# Patient Record
Sex: Male | Born: 1965
Health system: Southern US, Community
[De-identification: ages and names within clinical notes are randomized; demographics above are authoritative.]

## PROBLEM LIST (undated history)

## (undated) DIAGNOSIS — M79605 Pain in left leg: Secondary | ICD-10-CM

## (undated) HISTORY — DX: Pain in left leg: M79.605

## (undated) HISTORY — PX: OTHER SURGICAL HISTORY: SHX169

---

## 2013-08-04 ENCOUNTER — Ambulatory Visit
Admission: RE | Admit: 2013-08-04 | Discharge: 2013-08-04 | Disposition: A | Discharge status: Home / Self Care | Payer: Worker's Compensation | Source: Ambulatory Visit | Attending: Family | Admitting: Family

## 2013-08-04 ENCOUNTER — Other Ambulatory Visit: Payer: Self-pay | Admitting: Family

## 2013-08-04 DIAGNOSIS — M25572 Pain in left ankle and joints of left foot: Secondary | ICD-10-CM

## 2013-08-11 ENCOUNTER — Other Ambulatory Visit: Payer: Self-pay | Admitting: Family

## 2013-08-11 ENCOUNTER — Ambulatory Visit
Admission: RE | Admit: 2013-08-11 | Discharge: 2013-08-11 | Disposition: A | Discharge status: Home / Self Care | Payer: Worker's Compensation | Source: Ambulatory Visit | Attending: Family | Admitting: Family

## 2013-08-11 DIAGNOSIS — M545 Low back pain: Secondary | ICD-10-CM

## 2013-09-24 ENCOUNTER — Ambulatory Visit
Admission: RE | Admit: 2013-09-24 | Discharge: 2013-09-24 | Disposition: A | Discharge status: Home / Self Care | Payer: 59 | Source: Ambulatory Visit | Attending: Orthopaedic Surgery | Admitting: Orthopaedic Surgery

## 2013-09-24 ENCOUNTER — Other Ambulatory Visit: Payer: Self-pay | Admitting: Orthopaedic Surgery

## 2013-09-24 DIAGNOSIS — R609 Edema, unspecified: Secondary | ICD-10-CM

## 2013-09-24 DIAGNOSIS — R52 Pain, unspecified: Secondary | ICD-10-CM

## 2013-09-30 ENCOUNTER — Ambulatory Visit (INDEPENDENT_AMBULATORY_CARE_PROVIDER_SITE_OTHER): Payer: 59 | Admitting: Neurology

## 2013-09-30 ENCOUNTER — Encounter: Payer: Self-pay | Admitting: Neurology

## 2013-09-30 ENCOUNTER — Telehealth: Payer: Self-pay | Admitting: Neurology

## 2013-09-30 ENCOUNTER — Encounter (INDEPENDENT_AMBULATORY_CARE_PROVIDER_SITE_OTHER): Payer: Self-pay

## 2013-09-30 VITALS — BP 143/89 | HR 72 | Ht 77.0 in | Wt 226.0 lb

## 2013-09-30 DIAGNOSIS — M79672 Pain in left foot: Secondary | ICD-10-CM | POA: Insufficient documentation

## 2013-09-30 DIAGNOSIS — M79609 Pain in unspecified limb: Secondary | ICD-10-CM

## 2013-09-30 MED ORDER — DICLOFENAC SODIUM 1 % TD CREA: TOPICAL_CREAM | TRANSDERMAL | Status: AC

## 2013-09-30 MED ORDER — TRAMADOL HCL 50 MG PO TABS: 50.0000 mg | ORAL_TABLET | Freq: Four times a day (QID) | ORAL | Status: AC | PRN

## 2013-09-30 NOTE — Progress Notes (Signed)
PATIENT: John Moses DOB: 03/21/1966  HISTORICAL  John Moses is referred by his orthopedic patient physician Dr. 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  2 hours later, he noticed left first toe pain, at the bottom, and also between the first web space, later also noticed shooting pain to his left calf, pain to his left lateral leg, sometimes even above-knee level to his left lateral thigh, hip region but he denies significant low back pain,  Worsening pain of bearing weight, he denies shooting pain to his right lower extremity, no bowel bladder incontinence,  Over the past couple weeks, he reported 50% improvement,    MRI of left foot at [facility] orthopedic specialist  showed mild increased signal within the medial hallux sesamoid, there is no evidence of fracture   REVIEW OF SYSTEMS: Full 14 system review of systems performed and notable only for murmur, constipation, increased thirst, joint pain, achy muscles,   ALLERGIES: No Known Allergies  HOME MEDICATIONS:  PAST MEDICAL HISTORY: Past Medical History  Diagnosis Date  . Left leg pain     PAST SURGICAL HISTORY: Past Surgical History  Procedure Laterality Date  . Left hand      FAMILY HISTORY: Family History  Problem Relation Age of Onset  . High blood pressure Mother   . Diabetes Mother     SOCIAL HISTORY:  History   Social History  . Marital Status: Married    Spouse Name: N/A    Number of Children: 2  . Years of Education: 12   Occupational History    [employer], heavy lifting,    Social History Main Topics  . Smoking status: Never Smoker   . Smokeless tobacco: Never Used  . Alcohol Use: 0.5 oz/week    1 drink(s) per week     Comment: OCC  . Drug Use: No  . Sexual Activity:  Not on file   Other Topics Concern  . Not on file   Social History Narrative   Called patient. Advised patient of provider's approval for requested procedure, as well as any comments/instructions from provider.       Provided patient w/ verbal instructions concerning pre-, intra- and post-procedure preparation and instructions.      Patient verbalized understanding of the above.       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   Generalized: In no acute distress  Neck: Supple, no carotid bruits   Cardiac: Regular rate rhythm  Pulmonary: Clear to auscultation bilaterally  Musculoskeletal: No deformity  Neurological examination  Mentation: Alert oriented to time, place, history taking, and causual conversation  Cranial nerve II-XII: Pupils were equal round reactive to light. Extraocular movements were full.  Visual field were full on confrontational test. Bilateral fundi were sharp.  Facial sensation and strength were  normal. Hearing was intact to finger rubbing bilaterally. Uvula tongue midline.  Head turning and shoulder shrug and were normal and symmetric.Tongue protrusion into cheek strength was normal.  Motor: Normal tone, bulk and strength, tenderness at the base of left first toe, mild swelling  Sensory: Intact to fine touch, pinprick, preserved vibratory sensation, and proprioception at toes.  Coordination: Normal finger to nose, heel-to-shin bilaterally there was no truncal ataxia  Gait:  Atlgic due to left foot pain   Romberg signs: Negative  Deep tendon reflexes: Brachioradialis 2/2, biceps 2/2, triceps 2/2,  patellar 2/2, Achilles 2/2, plantar responses were flexor bilaterally.    ASSESSMENT AND PLAN  [name] is a 48 y.o. male  complains of left foot pain, after weight bearing, tenderness aTommi Emeryt the base of left first toe, consistent with left sesamoid damage, when necessary mobic hot compression, avoid weight bearing    There was no evidence of left lumbar radiculopathy, left lower extremity neuropathy,   Levert FeinsteinYijun Lavelle Akel, M.D. Ph.D.  [facility] [address], [address] [place], [place] [address] [phone]

## 2013-09-30 NOTE — Telephone Encounter (Signed)
John Moses, PT's wife, called and stated that the Orthopedic doctor - [name] that [name] had referred them to has moved to [place].  They have asked if there is another doctor she would recommend or should they see if someone at [name]'s current orthopedic doctor [facility] could assist with his problem.  Please call.  Thank you.

## 2013-10-01 NOTE — Telephone Encounter (Signed)
I called the pharmacy.  Spoke with Nedra HaiLee.  He said the diclofenac is not available in the cream form, so they are dispensing the gel.  The strength is the same.

## 2013-10-01 NOTE — Telephone Encounter (Signed)
Per Lynden Angathy, prescription only comes in  gel and is ready for pick up at preferred pharmacy, informed patient and he verbalized understanding

## 2013-10-01 NOTE — Telephone Encounter (Signed)
Patient also wanted to know why prescription(diclofenac% cream) was not at their pharmacy

## 2013-10-01 NOTE — Telephone Encounter (Signed)
Wife said that she would like for Dr Terrace ArabiaYan to recommend another orthopedic doctor since Dr Inda MerlinBednorz has moved

## 2013-10-02 ENCOUNTER — Ambulatory Visit (INDEPENDENT_AMBULATORY_CARE_PROVIDER_SITE_OTHER): Payer: 59 | Admitting: Neurology

## 2013-10-02 ENCOUNTER — Encounter (INDEPENDENT_AMBULATORY_CARE_PROVIDER_SITE_OTHER): Payer: Self-pay | Admitting: Radiology

## 2013-10-02 DIAGNOSIS — R29898 Other symptoms and signs involving the musculoskeletal system: Secondary | ICD-10-CM | POA: Insufficient documentation

## 2013-10-02 DIAGNOSIS — M79672 Pain in left foot: Secondary | ICD-10-CM

## 2013-10-02 DIAGNOSIS — M79609 Pain in unspecified limb: Secondary | ICD-10-CM

## 2013-10-02 DIAGNOSIS — Z0289 Encounter for other administrative examinations: Secondary | ICD-10-CM

## 2013-10-02 NOTE — Procedures (Signed)
   NCS (NERVE CONDUCTION STUDY) WITH EMG (ELECTROMYOGRAPHY) REPORT   STUDY DATE: February 12th 2015 PATIENT NAME: John Moses DOB: 01/14/1966 MRN: [id number]    TECHNOLOGIST: Kaylyn LimSue Fox ELECTROMYOGRAPHER: Levert FeinsteinYan, Charlott Calvario M.D.  CLINICAL INFORMATION:  48 years old right-handed [place] male, presenting with left foot pain, left hip pain after pulling heavy object  On examination: He has mild left first web space swelling, tenderness of left hallux, mild left ankle dorsiflexion weakness, also limited by pain  FINDINGS: NERVE CONDUCTION STUDY: Bilateral sural sensory responses were normal.  Left peroneal sensory response was absent. Right peroneal sensory response was normal. Bilateral tibial motor responses were normal.  Right peroneal to EDB  motor responses was absent.   Right peroneal to tibialis anterior motor  responses are normal.  Left peroneal to EDB motor responses showed significantly decreased C. map amplitude,  left peroneal to tibialis anterior also demonstrated significantly decreased C. map amplitude.    NEEDLE ELECTROMYOGRAPHY:  Selected needle examination was performed at left lower extremity muscles and left lumbosacral paraspinal muscles.  Left tibialis anterior , peroneal longus: Increased insertion activity, 2 plus spontaneous activity, enlarged complex motor unit potential with decreased recruitment patterns  Left medial gastrocnemius: Increased insertion activity, 1 plus spontaneous activity, enlarged complex motor unit potential with decreased recruitment patterns  Left vastus lateralis biceps femoris short head: Increased insertion activity,  no spontaneous activity, mixture of normal and some enlarged motor unit potential, with mildly decreased recruitment patterns.    He has poor relaxation, there was no spontaneous activity at the left lumbosacral paraspinal muscles.        IMPRESSION:   This is an abnormal study. 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     INTERPRETING PHYSICIAN:   Levert FeinsteinYan, Taria Castrillo M.D. Ph.D. [facility] [address], [address] [place], [place] [address] [phone]

## 2013-10-07 ENCOUNTER — Telehealth: Payer: Self-pay | Admitting: Neurology

## 2013-10-07 NOTE — Telephone Encounter (Signed)
[name], [facility] Called preauthorization for [place] MRI of L femur, he has active denervation on EMG/NCS,  DDx including left lumbar radiculopathy vs Left sciatic neuropathy.   He needs both MRI lumbar and MRI left sciatic pathway.  Pre-authorization Number: [id number]. Exp in 45 days, November 21, 2013.

## 2013-10-08 ENCOUNTER — Ambulatory Visit (INDEPENDENT_AMBULATORY_CARE_PROVIDER_SITE_OTHER): Payer: 59

## 2013-10-08 DIAGNOSIS — R29898 Other symptoms and signs involving the musculoskeletal system: Secondary | ICD-10-CM

## 2013-10-15 ENCOUNTER — Telehealth: Payer: Self-pay | Admitting: Neurology

## 2013-10-15 NOTE — Telephone Encounter (Signed)
John Moses, [name] assistant made an appt for patient to come into the office.

## 2013-10-15 NOTE — Telephone Encounter (Signed)
Lft msg for pt to call back, did advise we didn't show any availability for this visit. Per notes pt needs to be seen within 2 wks. Would like for nurse to call pt to set up an apt that you may have on your end that I don't. Thanks

## 2013-10-15 NOTE — Telephone Encounter (Signed)
Patient called back and he is scheduled with Dr.Yan 10-17-2013. Spoke to patient.

## 2013-10-15 NOTE — Telephone Encounter (Signed)
Called patient and left message for him to call office back for follow up apt.

## 2013-10-16 ENCOUNTER — Ambulatory Visit: Payer: 59 | Admitting: Neurology

## 2013-10-17 ENCOUNTER — Encounter: Payer: Self-pay | Admitting: *Deleted

## 2013-10-17 ENCOUNTER — Ambulatory Visit: Payer: Self-pay | Admitting: Neurology

## 2013-10-17 ENCOUNTER — Telehealth: Payer: Self-pay | Admitting: Neurology

## 2013-10-17 DIAGNOSIS — G5702 Lesion of sciatic nerve, left lower limb: Secondary | ICD-10-CM

## 2013-10-17 NOTE — Progress Notes (Signed)
Quick Note:  Shared normal MR Lumbar Spine results thru VM message ______

## 2013-10-17 NOTE — Telephone Encounter (Signed)
Patient requesting results on the MRI of his leg. Please call.

## 2013-10-17 NOTE — Telephone Encounter (Signed)
John Moses, I also ordered MRI of right thigh, please findout weather he had it or not,   I failed to reach him, left message for him

## 2013-10-17 NOTE — Telephone Encounter (Signed)
[name] was sent a note regarding this message.

## 2013-10-17 NOTE — Telephone Encounter (Signed)
Patient requesting a note for work because his appt was cancelled due to the weather and patient also requesting his MRI results of his leg. Please advise.

## 2013-10-17 NOTE — Telephone Encounter (Signed)
Patient requesting another note for being off work due to having to reschedule today's appointment due to weather. Please call to advise.

## 2013-10-21 ENCOUNTER — Telehealth: Payer: Self-pay | Admitting: Neurology

## 2013-10-21 NOTE — Telephone Encounter (Signed)
Pt called and stated that he did have the MRI on his right thigh on 2-18.  He would like to know the results from that.  Please call.  Thank you

## 2013-10-21 NOTE — Telephone Encounter (Signed)
Created by mistake

## 2013-10-24 DIAGNOSIS — G5702 Lesion of sciatic nerve, left lower limb: Secondary | ICD-10-CM | POA: Insufficient documentation

## 2013-10-24 NOTE — Telephone Encounter (Signed)
I have received a call from radiologist concerning his MRI of left femoral, there is evidence of left hamstring muscle denervation, but no compressing lesions to the left sciatic nerve  I have discussed with him about findings, he continued to have left leg pain, weakness, we will proceed with MRI of pelvic, with without contrast, continue followup in March 11

## 2013-10-28 NOTE — Telephone Encounter (Signed)
I got a stat MRI pelvic, there was no evidence of left sciatic nerve compression, mild bilateral SI joint disease. There is no evidence of bilateral illopsaos muscle signal abnormality  Keep follow up as previous scheduled.

## 2013-10-29 ENCOUNTER — Telehealth: Payer: Self-pay | Admitting: Neurology

## 2013-10-29 ENCOUNTER — Encounter: Payer: Self-pay | Admitting: Neurology

## 2013-10-29 ENCOUNTER — Ambulatory Visit (INDEPENDENT_AMBULATORY_CARE_PROVIDER_SITE_OTHER): Payer: 59 | Admitting: Neurology

## 2013-10-29 VITALS — BP 124/81 | HR 75 | Ht 76.0 in | Wt 219.0 lb

## 2013-10-29 DIAGNOSIS — G5702 Lesion of sciatic nerve, left lower limb: Secondary | ICD-10-CM

## 2013-10-29 DIAGNOSIS — G5700 Lesion of sciatic nerve, unspecified lower limb: Secondary | ICD-10-CM

## 2013-10-29 NOTE — Progress Notes (Signed)
PATIENT: John Moses DOB: [birth year]  HISTORICAL  John Moses is referred by his orthopedic patient physician Dr. 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  2 hours later, he noticed left first toe pain, at the bottom, and also between the first web space, later also noticed shooting pain to his left calf, pain to his left lateral leg, sometimes even above-knee level to his left lateral thigh, hip region but he denies significant low back pain,  Worsening pain of bearing weight, he denies shooting pain to his right lower extremity, no bowel bladder incontinence,  Over the past couple weeks, he reported 50% improvement,    MRI of left foot at [facility] orthopedic specialist  showed mild increased signal within the medial hallux sesamoid, there is no evidence of fracture  UPDATE October 29 2013: His left foot, toe pain has much improved, 50% improvement, he was given NSAIDs, he complains of mild left lateral ankle pain while walking. He denies significant low back pain,  We have reviewed MRI lumbar, pelvic, and left femoral together, there was evidence of left hamstring muscle signal change, but there was no evidence of left sciatic nerve compression, MRI of lumbar was normal   REVIEW OF SYSTEMS: Full 14 system review of systems performed and notable only for unexpected weight change, light sensitivity, ALLERGIES: No Known Allergies  HOME MEDICATIONS:  PAST MEDICAL HISTORY: Past Medical History  Diagnosis Date  . Left leg pain     PAST SURGICAL HISTORY: Past Surgical History  Procedure Laterality Date  . Left hand      FAMILY HISTORY: Family History  Problem Relation Age of Onset  . High blood pressure Mother   . Diabetes Mother     SOCIAL HISTORY:  History    Social History  . Marital Status: Married    Spouse Name: N/A    Number of Children: 2  . Years of Education: 12   Occupational History    [employer], heavy lifting,    Social History Main Topics  . Smoking status: Never Smoker   . Smokeless tobacco: Never Used  . Alcohol Use: 0.5 oz/week    1 drink(s) per week     Comment: OCC  . Drug Use: No  . Sexual Activity: Not on file   Other Topics Concern  . Not on file   Social History Narrative   Called patient. Advised patient of provider's approval for requested procedure, as well as any comments/instructions from provider.       Provided patient w/ verbal instructions concerning pre-, intra- and post-procedure preparation and instructions.      Patient verbalized understanding of the above.       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   Generalized: In no acute distress  Neck: Supple, no carotid  bruits   Cardiac: Regular rate rhythm  Pulmonary: Clear to auscultation bilaterally  Musculoskeletal: No deformity  Neurological examination  Mentation: Alert oriented to time, place, history taking, and causual conversation  Cranial nerve II-XII: Pupils were equal round reactive to light. Extraocular movements were full.  Visual field were full on confrontational test. Bilateral fundi were sharp.  Facial sensation and strength were normal. Hearing was intact to finger rubbing bilaterally. Uvula tongue midline.  Head turning and shoulder shrug and were normal and symmetric.Tongue protrusion into cheek strength was normal.  Motor: tenderness at the base of left  first toe, mild swelling, mild left ankle dorsiflexion weakness 4, plantar flexion weakness 4 minus, eversion 4,  Inversion, 4 minus  Sensory: decreased light touch at left first web space, extending to left dorsal foot and lateral ankle  Coordination: Normal finger to nose, heel-to-shin bilaterally there was no truncal ataxia  Gait:  Atlgic due to left foot pain, he could not step on left tiptoe, or heel,  Romberg signs: Negative  Deep tendon reflexes: Brachioradialis 2/2, biceps 2/2, triceps 2/2, patellar 2/2, Achilles 2/1, plantar responses were flexor bilaterally.    ASSESSMENT AND PLAN  John Emeryrevis L Recendiz is a 48 y.o. male  complains of left foot pain, after weight bearing, distal left leg weakness, evidence of left sciatic neuropathy, proximal to the takeoff left biceps femoris long head  1. 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 continue physical therapy 3 return to clinic in 6 months.    Levert FeinsteinYijun Kimon Loewen, M.D. Ph.D.  [facility] [address], [address] [place], [place] [address] [phone]

## 2013-10-29 NOTE — Telephone Encounter (Signed)
Pt called and stated that when he was in the office today, [name] gave him a letter stating that it is ok for him to return to work, but to avoid heavy lifting and time on his feet. He stated that his job is requesting clarification of what she means by heavy lifting - the weight limit and what is considered a long time walking and on his feet.  He states that he is supposed to return to work on Monday, and he stated that he can come by anytime before then to pick it up.  Please call if necessary.  Thank you

## 2013-10-30 ENCOUNTER — Telehealth: Payer: Self-pay | Admitting: Neurology

## 2013-10-30 NOTE — Telephone Encounter (Signed)
Patient is calling about the letter that was written to return to work does not have a specific amount of weight that the patient can lift. Patient is requesting that it be put into the letter. Please advise.

## 2013-10-30 NOTE — Telephone Encounter (Signed)
Lupita LeashDonna:  Please call patient, advise him to get paperwork from his human resource, we can fill the paper work, accordingly.  If his HR does not have paperwork, please clarify with him, carry less then 15 Lb, on feet less than 1 hour  each day.

## 2013-10-30 NOTE — Telephone Encounter (Signed)
Patient calling regarding his return to work note. It does not specify the amount of weight that he is able to lift. Please call the patient to advise.

## 2013-10-31 ENCOUNTER — Encounter: Payer: Self-pay | Admitting: Neurology

## 2013-11-03 NOTE — Telephone Encounter (Signed)
Spoke to patient and he relayed that a new letter needed to say the doctors restrictions.  The letter has been written and signed.  Patient called and told it would be left up front for pick up.

## 2013-11-04 DIAGNOSIS — Z0289 Encounter for other administrative examinations: Secondary | ICD-10-CM

## 2013-11-10 NOTE — Telephone Encounter (Signed)
Pt returning Sandy's call regarding his FMLA paperwork

## 2013-11-11 NOTE — Telephone Encounter (Signed)
I spoke to pt and form filled out and given to [name].

## 2013-11-13 ENCOUNTER — Telehealth: Payer: Self-pay | Admitting: Neurology

## 2013-11-13 ENCOUNTER — Encounter: Payer: Self-pay | Admitting: Neurology

## 2013-11-13 NOTE — Telephone Encounter (Signed)
I called patient, left message. I'll write a letter as delineated on the note.

## 2013-11-13 NOTE — Telephone Encounter (Signed)
Patient requesting new letter written for employer stating that he can lift 45 to 50 pounds (not something he does constantly but at times needs to shovel rock, etc.). 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  Please call patient to advise.

## 2013-11-14 ENCOUNTER — Encounter: Payer: Self-pay | Admitting: Neurology

## 2014-04-29 ENCOUNTER — Ambulatory Visit: Payer: 59 | Admitting: Neurology

## 2014-05-12 ENCOUNTER — Ambulatory Visit: Payer: Self-pay | Admitting: Neurology

## 2015-07-16 IMAGING — US US EXTREM LOW VENOUS*L*
1 series · 14 of 24 positions shown · non-contrast
Comparison: None.

CLINICAL DATA: Nerve pain to the back of the foot

EXAM:
LEFT LOWER EXTREMITY VENOUS DOPPLER ULTRASOUND
TECHNIQUE: Gray-scale sonography with graded compression, as well as color
Doppler and duplex ultrasound, were performed to evaluate the deep
venous system from the level of the common femoral vein through the
popliteal and proximal calf veins. Spectral Doppler was utilized to
evaluate flow at rest and with distal augmentation maneuvers.

[Series 1: us extrem low venous*left* · 14 of 28 slices shown]
[im 1/28]
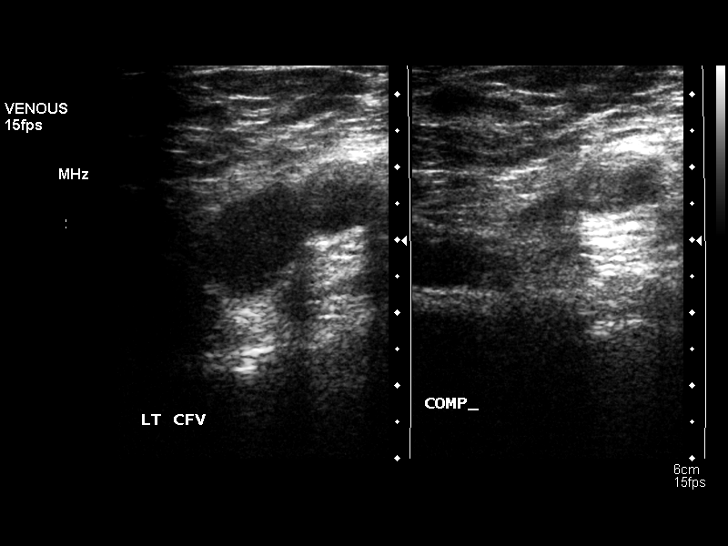
[im 3/28]
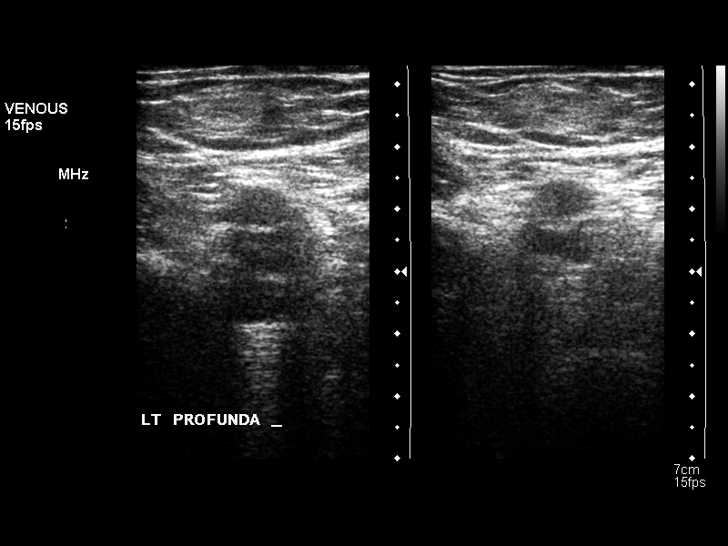
[im 5/28]
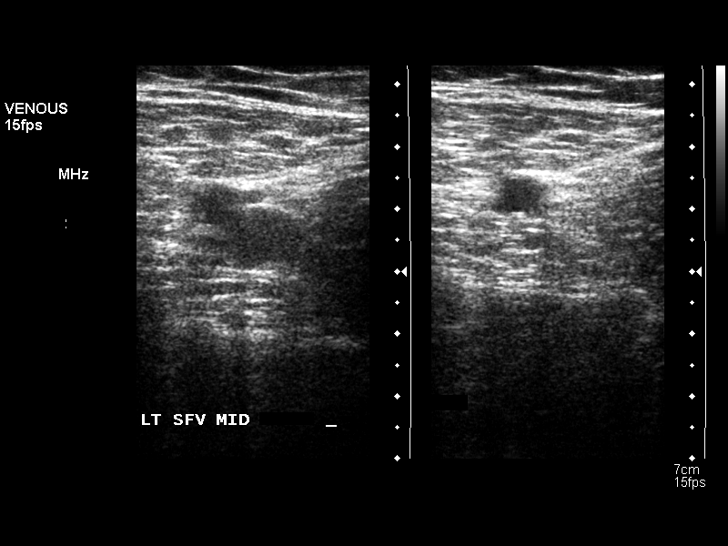
[im 8/28]
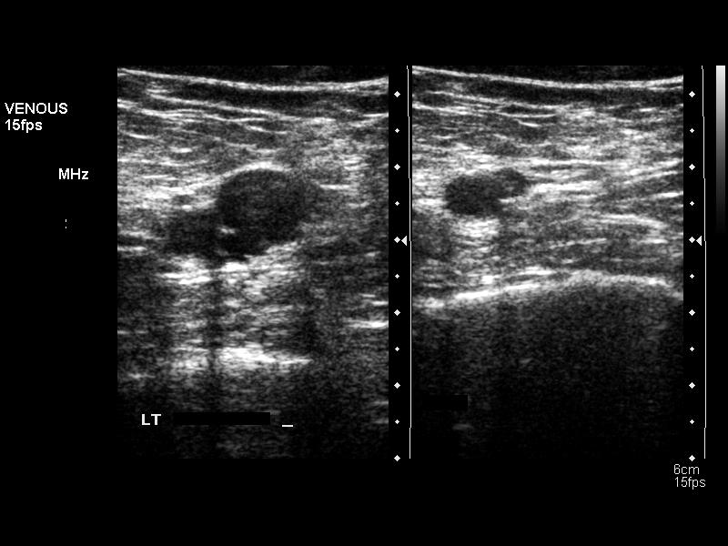
[im 9/28]
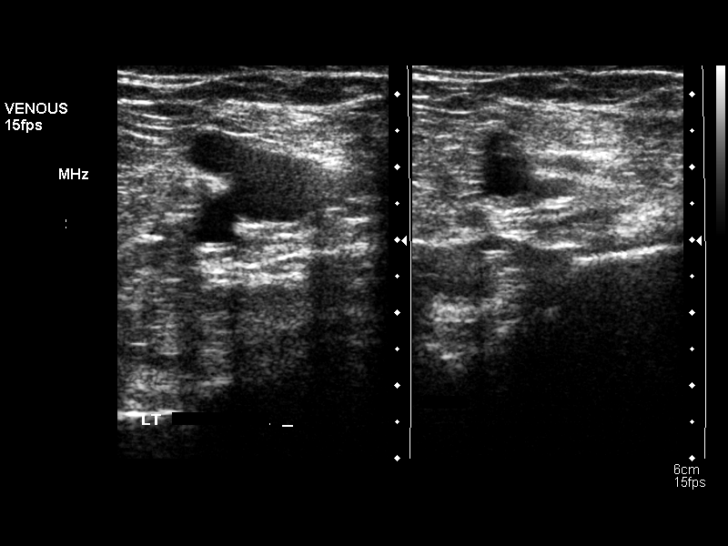
[im 11/28]
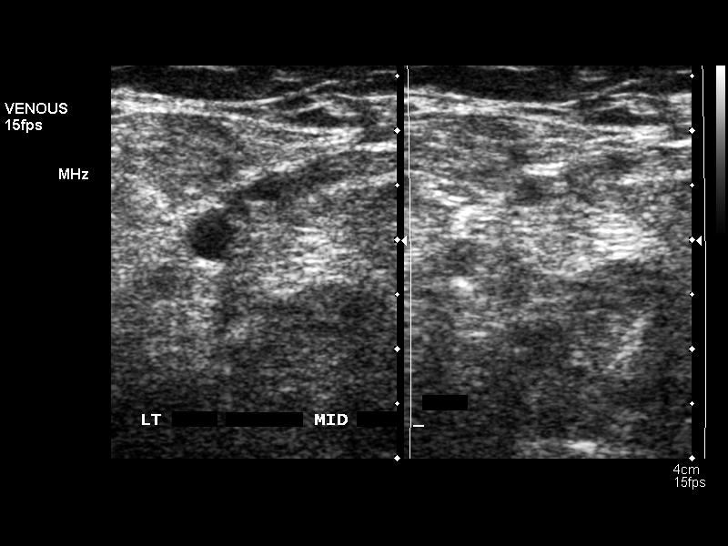
[im 13/28]
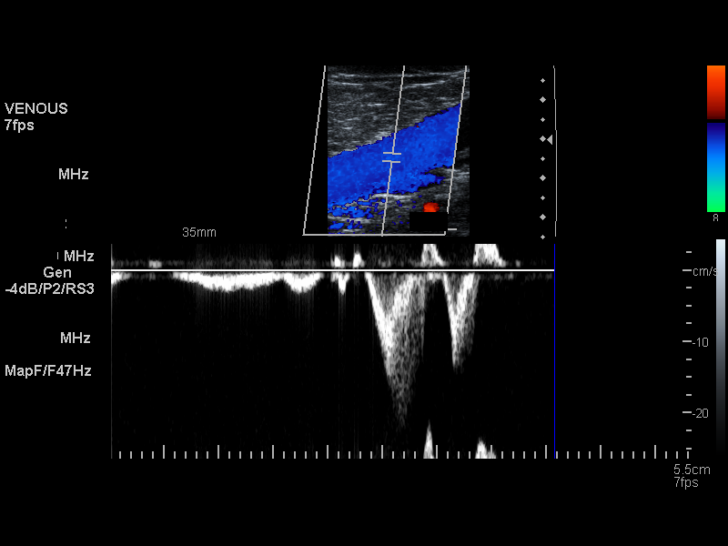
[im 15/28]
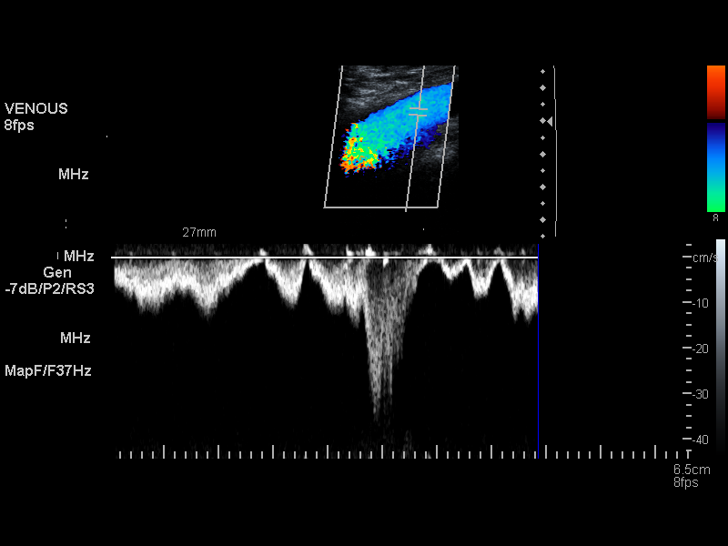
[im 17/28]
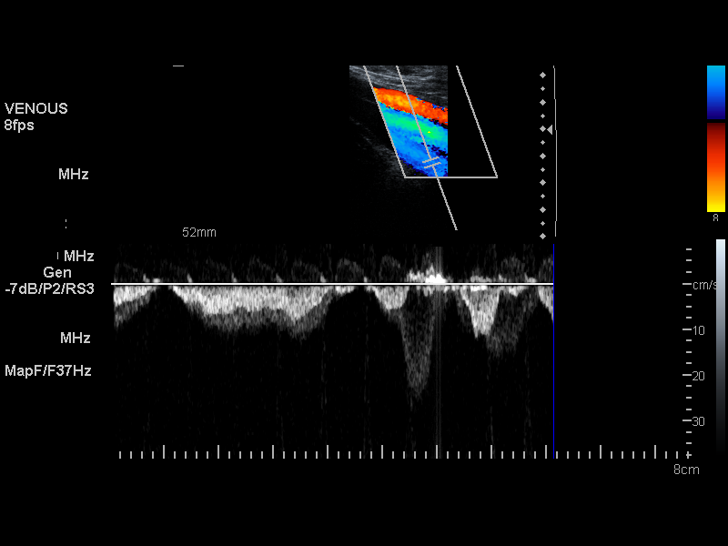
[im 19/28]
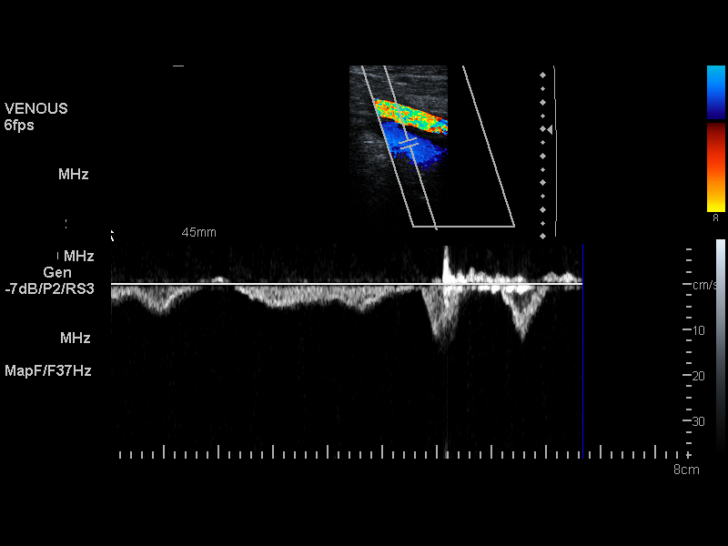
[im 22/28]
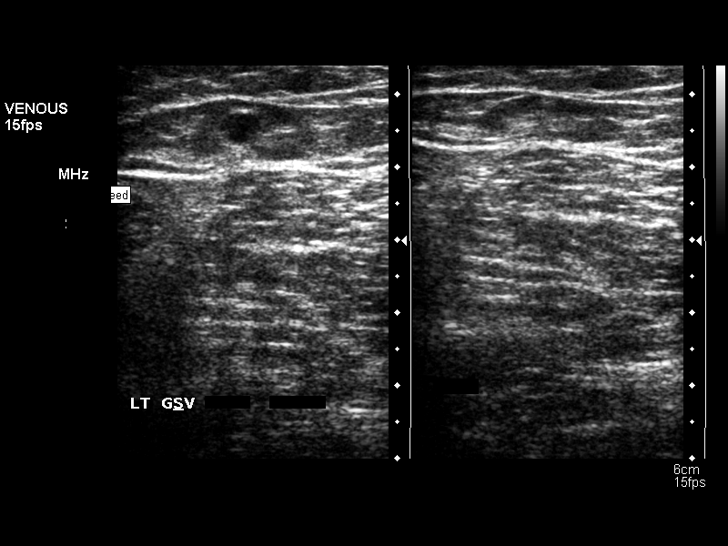
[im 23/28]
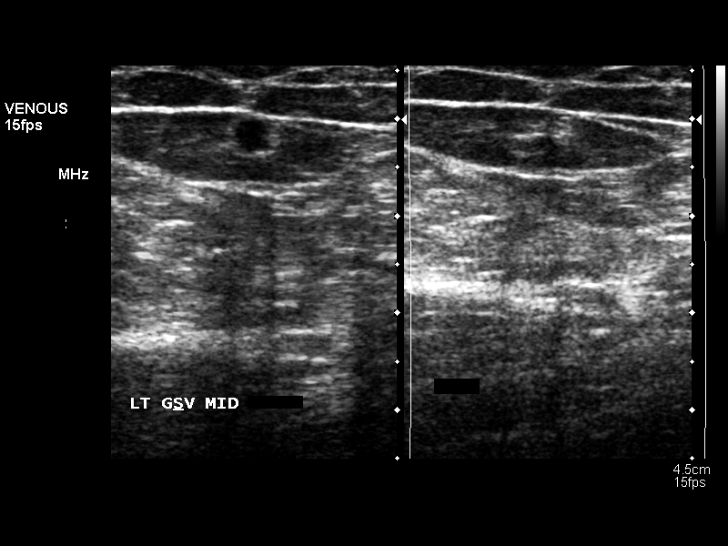
[im 25/28]
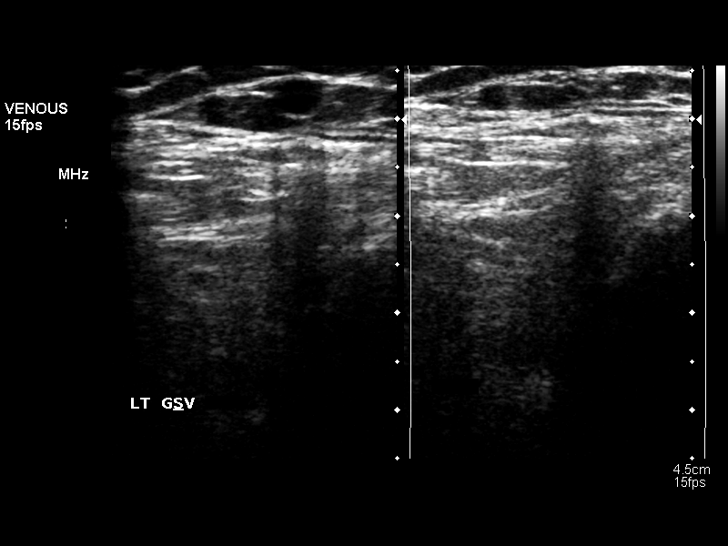
[im 28/28]
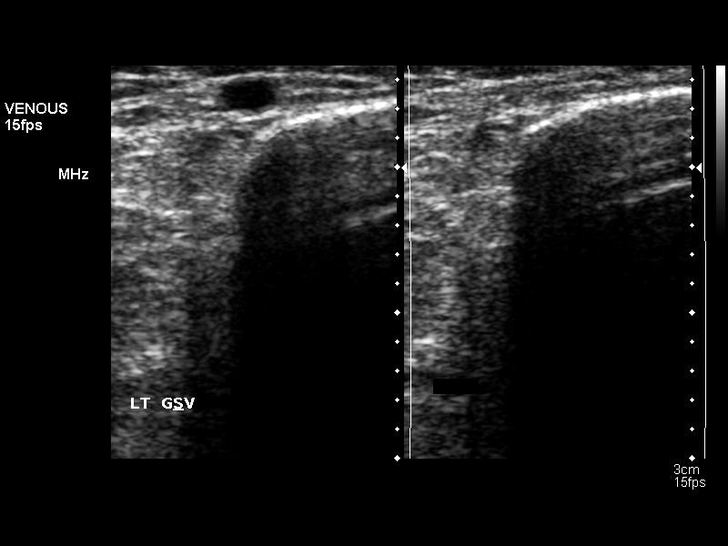

[14 of 24 positions shown; findings below may reference images not displayed]

FINDINGS: Thrombus within deep veins:  None visualized.

Compressibility of deep veins:  Normal.

Duplex waveform respiratory phasicity:  Normal.

Duplex waveform response to augmentation:  Normal.

Venous reflux:  None visualized.

Other findings:  None visualized.
IMPRESSION: No deep venous thrombosis of the left lower extremity.

## 2018-09-11 DIAGNOSIS — R030 Elevated blood-pressure reading, without diagnosis of hypertension: Secondary | ICD-10-CM | POA: Diagnosis not present

## 2018-09-11 DIAGNOSIS — H9191 Unspecified hearing loss, right ear: Secondary | ICD-10-CM | POA: Diagnosis not present

## 2020-08-27 ENCOUNTER — Ambulatory Visit: Payer: Self-pay | Attending: Internal Medicine

## 2020-08-27 DIAGNOSIS — Z23 Encounter for immunization: Secondary | ICD-10-CM

## 2020-08-27 NOTE — Progress Notes (Signed)
   Covid-19 Vaccination Clinic  Name:  DOMENIK TRICE    MRN: [id number] DOB: 01/28/1966  08/27/2020  Mr. Goodrich was observed post Covid-19 immunization for 15 minutes without incident. He was provided with Vaccine Information Sheet and instruction to access the V-Safe system.   Mr. Deeley was instructed to call 911 with any severe reactions post vaccine: Marland Kitchen Difficulty breathing  . Swelling of face and throat  . A fast heartbeat  . A bad rash all over body  . Dizziness and weakness   Immunizations Administered    Name Date Dose VIS Date Route   Pfizer COVID-19 Vaccine 08/27/2020  2:20 PM 0.3 mL 06/09/2020 Intramuscular   Manufacturer: [employer], [employer]   Lot: [id number]   NDC: [address]-5

## 2023-07-14 ENCOUNTER — Emergency Department (HOSPITAL_BASED_OUTPATIENT_CLINIC_OR_DEPARTMENT_OTHER): Payer: 59

## 2023-07-14 ENCOUNTER — Other Ambulatory Visit: Payer: Self-pay

## 2023-07-14 ENCOUNTER — Emergency Department (HOSPITAL_BASED_OUTPATIENT_CLINIC_OR_DEPARTMENT_OTHER)
Admission: EM | Admit: 2023-07-14 | Discharge: 2023-07-15 | Disposition: A | Discharge status: Home / Self Care | Payer: 59 | Attending: Emergency Medicine | Admitting: Emergency Medicine

## 2023-07-14 ENCOUNTER — Encounter (HOSPITAL_BASED_OUTPATIENT_CLINIC_OR_DEPARTMENT_OTHER): Payer: Self-pay

## 2023-07-14 DIAGNOSIS — J40 Bronchitis, not specified as acute or chronic: Secondary | ICD-10-CM | POA: Insufficient documentation

## 2023-07-14 DIAGNOSIS — J45909 Unspecified asthma, uncomplicated: Secondary | ICD-10-CM | POA: Diagnosis not present

## 2023-07-14 DIAGNOSIS — Z20822 Contact with and (suspected) exposure to covid-19: Secondary | ICD-10-CM | POA: Insufficient documentation

## 2023-07-14 DIAGNOSIS — R0602 Shortness of breath: Secondary | ICD-10-CM | POA: Diagnosis present

## 2023-07-14 DIAGNOSIS — R062 Wheezing: Secondary | ICD-10-CM

## 2023-07-14 DIAGNOSIS — Z7952 Long term (current) use of systemic steroids: Secondary | ICD-10-CM | POA: Diagnosis not present

## 2023-07-14 MED ORDER — ALBUTEROL SULFATE HFA 108 (90 BASE) MCG/ACT IN AERS
2.0000 | INHALATION_SPRAY | RESPIRATORY_TRACT | Status: DC | PRN
  Administered 2023-07-15: 2 via RESPIRATORY_TRACT
  Filled 2023-07-14: qty 6.7

## 2023-07-14 MED ORDER — SODIUM CHLORIDE 0.9 % IV BOLUS
500.0000 mL | Freq: Once | INTRAVENOUS | Status: AC
  Administered 2023-07-15: 500 mL via INTRAVENOUS

## 2023-07-14 MED ORDER — MAGNESIUM SULFATE 2 GM/50ML IV SOLN
2.0000 g | Freq: Once | INTRAVENOUS | Status: AC
  Administered 2023-07-15: 2 g via INTRAVENOUS
  Filled 2023-07-14: qty 50

## 2023-07-14 MED ORDER — METHYLPREDNISOLONE SODIUM SUCC 125 MG IJ SOLR
125.0000 mg | Freq: Once | INTRAMUSCULAR | Status: AC
  Administered 2023-07-15: 125 mg via INTRAVENOUS
  Filled 2023-07-14: qty 2

## 2023-07-14 MED ORDER — ALBUTEROL SULFATE (2.5 MG/3ML) 0.083% IN NEBU
2.5000 mg | INHALATION_SOLUTION | Freq: Once | RESPIRATORY_TRACT | Status: AC
  Administered 2023-07-14: 2.5 mg via RESPIRATORY_TRACT
  Filled 2023-07-14: qty 3

## 2023-07-14 MED ORDER — IPRATROPIUM-ALBUTEROL 0.5-2.5 (3) MG/3ML IN SOLN
3.0000 mL | Freq: Once | RESPIRATORY_TRACT | Status: AC
  Administered 2023-07-14: 3 mL via RESPIRATORY_TRACT
  Filled 2023-07-14: qty 3

## 2023-07-14 NOTE — ED Triage Notes (Signed)
Pt to triage c/o SOB x 4 days with wheezing. Pt states he was treated for bronchitis and SOB has worsened since completing medication. RR 26 with accessory muscles. O2 sat 90% on room air. Pt denies fever. EKG completed.

## 2023-07-14 NOTE — ED Provider Notes (Signed)
[place] EMERGENCY DEPARTMENT AT [facility] Provider Note  CSN: [id number] Arrival date & time: 07/14/23 2305  Chief Complaint(s) Shortness of Breath  HPI John Moses is a 57 y.o. male with past medical history as below, significant for childhood asthma, neuropathy left leg who presents to the ED with complaint of cough, dyspnea  Patient reports has been feeling unwell for the past 2-3 weeks, he was seen at PCPs office, treated for pneumonia with steroids, antibiotics.  He was feeling better up until he completed treatment they started feeling worse again.  He has been having nonproductive cough, postnasal drip, dyspnea.  He uses rescue inhaler at home for his asthma but does not take any controller medications, symptoms typically do get worse when he has an infection.  He has no chest pain, no leg swelling, no fevers or chills, no abdominal pain nausea or vomiting.  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 Authorizing Provider  azithromycin (ZITHROMAX) 250 MG tablet Take 1 tablet (250 mg total) by mouth daily. Take first 2 tablets together, then 1 every day until finished. 07/15/23  Yes Tanda Rockers A, DO  guaiFENesin-dextromethorphan (ROBITUSSIN DM) 100-10 MG/5ML syrup Take 5 mLs by mouth every 4 (four) hours as needed for cough. 07/15/23  Yes Tanda Rockers A, DO  oxymetazoline (AFRIN NASAL SPRAY) 0.05 % nasal spray Place 1 spray into both nostrils 2 (two) times daily for 3 days. 07/15/23 07/18/23 Yes Sloan Leiter, DO  predniSONE (DELTASONE) 20 MG tablet Take 2 tablets (40 mg total) by mouth daily for 5 days. 07/16/23 07/21/23 Yes Tanda Rockers A, DO  Diclofenac Sodium 1 % CREA  Use on left foot as needed. 09/30/13   [name], MD  HYDROcodone-acetaminophen (NORCO) 10-325 MG per tablet Take 1 tablet by mouth every 6 (six) hours as needed.    [provider]  Naproxen Sodium (ALEVE PO) Take by mouth as needed.    [provider]  traMADol (ULTRAM) 50 MG tablet Take 1 tablet (50 mg total) by mouth every 6 (six) hours as needed. 09/30/13   [name], MD                                                                                                                                    Past Surgical History Past Surgical History:  Procedure Laterality Date   left hand     Family History Family History  Problem Relation Age of Onset   High blood pressure Mother    Diabetes Mother     Social History Social History   Tobacco Use  Smoking status: Never   Smokeless tobacco: Never  Substance Use Topics   Alcohol use: Yes    Alcohol/week: 1.0 standard drink of alcohol    Types: 1 drink(s) per week    Comment: OCC   Drug use: No   Allergies Patient has no known allergies.  Review of Systems Review of Systems  Constitutional:  Negative for chills, fever and unexpected weight change.  Respiratory:  Positive for cough and shortness of breath.   Cardiovascular:  Negative for chest pain, palpitations and leg swelling.  Gastrointestinal:  Negative for abdominal pain, nausea and vomiting.  Genitourinary:  Negative for dysuria.  Neurological:  Negative for syncope.  All other systems reviewed and are negative.   Physical Exam Vital Signs  I have reviewed the triage vital signs BP 132/84   Pulse 84   Temp 98.4 F (36.9 C) (Oral)   Resp (!) 21   Wt 113.4 kg   SpO2 95%   BMI 30.43 kg/m  Physical Exam Vitals and nursing note reviewed.  Constitutional:      General: He is not in acute distress.    Appearance: He is well-developed. He is not diaphoretic.  HENT:     Head: Normocephalic and atraumatic.     Right Ear: External ear normal.      Left Ear: External ear normal.     Mouth/Throat:     Mouth: Mucous membranes are moist.  Eyes:     General: No scleral icterus. Cardiovascular:     Rate and Rhythm: Regular rhythm. Tachycardia present.     Pulses: Normal pulses.     Heart sounds: Normal heart sounds.  Pulmonary:     Effort: Tachypnea and accessory muscle usage present. No respiratory distress.     Breath sounds: Decreased breath sounds and wheezing present.     Comments: Diffuse wheezing Abdominal:     General: Abdomen is flat.     Palpations: Abdomen is soft.     Tenderness: There is no abdominal tenderness.  Musculoskeletal:     Cervical back: No rigidity.     Right lower leg: No edema.     Left lower leg: No edema.  Skin:    General: Skin is warm and dry.     Capillary Refill: Capillary refill takes less than 2 seconds.     Coloration: Skin is not jaundiced.  Neurological:     Mental Status: He is alert.  Psychiatric:        Mood and Affect: Mood normal.        Behavior: Behavior normal.     ED Results and Treatments Labs (all labs ordered are listed, but only abnormal results are displayed) Labs Reviewed  BASIC METABOLIC PANEL - Abnormal; Notable for the following components:      Result Value   Glucose, Bld 119 (*)    All other components within normal limits  RESP PANEL BY RT-PCR (RSV, FLU A&B, COVID)  RVPGX2  CBC WITH DIFFERENTIAL/PLATELET  BRAIN NATRIURETIC PEPTIDE  TROPONIN I (HIGH SENSITIVITY)  Radiology DG Chest Port 1 View  Result Date: 07/14/2023 CLINICAL DATA:  Shortness of breath and wheezing.  Chest pain EXAM: PORTABLE CHEST 1 VIEW COMPARISON:  None Available. FINDINGS: Perihilar and peribronchial thickening. No focal consolidation, pneumothorax or pleural effusion. Normal cardiomediastinal silhouette. No acute bone abnormality. IMPRESSION: Bronchitis/reactive  airways. Electronically Signed   By: Minerva Fester M.D.   On: 07/14/2023 23:50    Pertinent labs & imaging results that were available during my care of the patient were reviewed by me and considered in my medical decision making (see MDM for details).  Medications Ordered in ED Medications  albuterol (VENTOLIN HFA) 108 (90 Base) MCG/ACT inhaler 2 puff (2 puffs Inhalation Given 07/15/23 0303)  ipratropium-albuterol (DUONEB) 0.5-2.5 (3) MG/3ML nebulizer solution 3 mL (3 mLs Nebulization Given 07/14/23 2345)  albuterol (PROVENTIL) (2.5 MG/3ML) 0.083% nebulizer solution 2.5 mg (2.5 mg Nebulization Given 07/14/23 2345)  methylPREDNISolone sodium succinate (SOLU-MEDROL) 125 mg/2 mL injection 125 mg (125 mg Intravenous Given 07/15/23 0030)  magnesium sulfate IVPB 2 g 50 mL (0 g Intravenous Stopped 07/15/23 0134)  sodium chloride 0.9 % bolus 500 mL (0 mLs Intravenous Stopped 07/15/23 0134)  AeroChamber Plus Flo-Vu Medium MISC 1 each (1 each Other Given 07/15/23 0303)                                                                                                                                     Procedures Procedures  (including critical care time)  Medical Decision Making / ED Course    Medical Decision Making:    NORVELL RALSTON is a 57 y.o. male with past medical history as below, significant for childhood asthma, neuropathy left leg who presents to the ED with complaint of cough, dyspnea. The complaint involves an extensive differential diagnosis and also carries with it a high risk of complications and morbidity.  Serious etiology was considered. Ddx includes but is not limited to: In my evaluation of this patient's dyspnea my DDx includes, but is not limited to, pneumonia, pulmonary embolism, pneumothorax, pulmonary edema, metabolic acidosis, asthma, COPD, cardiac cause, anemia, anxiety, etc.    Complete initial physical exam performed, notably the patient was in respiratory  distress, hypoxia noted, diffuse wheezing, some accessory muscle use.    Reviewed and confirmed nursing documentation for past medical history, family history, social history.  Vital signs reviewed.    Patient with ongoing difficulty breathing over the past 2 to 3 weeks, worsened once he completed treatment for recent presumed pneumonia.  Diffuse wheezing on initial assessment.  Will give nebulized breathing treatment, steroids, mag sulfate, IV fluids, check screening labs chest x-ray.  Clinical Course as of 07/15/23 0305  Wynelle Link Jul 15, 2023  0203 Feeling better on recheck, labs stable  [SG]  0234 CXR w/ bronchitis/reactive airway, no obvious infiltrate [SG]    Clinical Course User Index [SG] Sloan Leiter, DO    Brief summary: 57 year old  male history of childhood asthma recently treated for pneumonia.  Here with wheezing, difficulty breathing.  Given nebulized breathing treatments, steroids, mag sulfate, IV fluids.  Screening labs and imaging are stable.  He is feeling much better on recheck, no hypoxia.  Wheezing has resolved.  Concern for possible bronchitis, reactive airway disease.  Will give course of steroids, give spacer for his albuterol inhaler.  Antitussive, Afrin.  Azithromycin.  Follow-up PCP  The patient improved significantly and was discharged in stable condition. Detailed discussions were had with the patient regarding current findings, and need for close f/u with PCP or on call doctor. The patient has been instructed to return immediately if the symptoms worsen in any way for re-evaluation. Patient verbalized understanding and is in agreement with current care plan. All questions answered prior to discharge.                  Additional history obtained: -Additional history obtained from spouse -External records from outside source obtained and reviewed including: Chart review including previous notes, labs, imaging, consultation notes including  Primary care  documentation Home medications Prior imaging    Lab Tests: -I ordered, reviewed, and interpreted labs.   The pertinent results include:   Labs Reviewed  BASIC METABOLIC PANEL - Abnormal; Notable for the following components:      Result Value   Glucose, Bld 119 (*)    All other components within normal limits  RESP PANEL BY RT-PCR (RSV, FLU A&B, COVID)  RVPGX2  CBC WITH DIFFERENTIAL/PLATELET  BRAIN NATRIURETIC PEPTIDE  TROPONIN I (HIGH SENSITIVITY)    Notable for labs stable  EKG   EKG Interpretation Date/Time:  Sunday July 15 2023 00:06:40 EST Ventricular Rate:  98 PR Interval:  202 QRS Duration:  103 QT Interval:  352 QTC Calculation: 450 R Axis:   -44  Text Interpretation: Sinus rhythm Borderline prolonged PR interval Probable left atrial enlargement Left axis deviation Confirmed by Tanda Rockers ([id number]) on 07/15/2023 12:26:29 AM         Imaging Studies ordered: I ordered imaging studies including chest x-ray I independently visualized the following imaging with scope of interpretation limited to determining acute life threatening conditions related to emergency care; findings noted above I independently visualized and interpreted imaging. I agree with the radiologist interpretation   Medicines ordered and prescription drug management: Meds ordered this encounter  Medications   albuterol (VENTOLIN HFA) 108 (90 Base) MCG/ACT inhaler 2 puff   ipratropium-albuterol (DUONEB) 0.5-2.5 (3) MG/3ML nebulizer solution 3 mL   albuterol (PROVENTIL) (2.5 MG/3ML) 0.083% nebulizer solution 2.5 mg   methylPREDNISolone sodium succinate (SOLU-MEDROL) 125 mg/2 mL injection 125 mg    IV methylprednisolone will be converted to either a q12h or q24h frequency with the same total daily dose (TDD).  Ordered Dose: 1 to 125 mg TDD; convert to: TDD q24h.  Ordered Dose: 126 to 250 mg TDD; convert to: TDD div q12h.  Ordered Dose: >250 mg TDD; DAW.   magnesium sulfate IVPB 2 g 50 mL    sodium chloride 0.9 % bolus 500 mL   DISCONTD: aerochamber plus with mask device 1 each   AeroChamber Plus Flo-Vu Medium MISC 1 each   predniSONE (DELTASONE) 20 MG tablet    Sig: Take 2 tablets (40 mg total) by mouth daily for 5 days.    Dispense:  10 tablet    Refill:  0   guaiFENesin-dextromethorphan (ROBITUSSIN DM) 100-10 MG/5ML syrup    Sig: Take 5 mLs by mouth  every 4 (four) hours as needed for cough.    Dispense:  118 mL    Refill:  0   azithromycin (ZITHROMAX) 250 MG tablet    Sig: Take 1 tablet (250 mg total) by mouth daily. Take first 2 tablets together, then 1 every day until finished.    Dispense:  6 tablet    Refill:  0   oxymetazoline (AFRIN NASAL SPRAY) 0.05 % nasal spray    Sig: Place 1 spray into both nostrils 2 (two) times daily for 3 days.    Dispense:  15 mL    Refill:  0    -I have reviewed the patients home medicines and have made adjustments as needed   Consultations Obtained: na   Cardiac Monitoring: The patient was maintained on a cardiac monitor.  I personally viewed and interpreted the cardiac monitored which showed an underlying rhythm of: sinus tachy Continuous pulse oximetry interpreted by myself, 90% on RA. 96% on 2L, on recheck 97% on RA   Social Determinants of Health:  Diagnosis or treatment significantly limited by social determinants of health: lives at home, non smoker   Reevaluation: After the interventions noted above, I reevaluated the patient and found that they have improved  Co morbidities that complicate the patient evaluation  Past Medical History:  Diagnosis Date   Left leg pain       Dispostion: Disposition decision including need for hospitalization was considered, and patient discharged from emergency department.    Final Clinical Impression(s) / ED Diagnoses Final diagnoses:  Bronchitis  Wheezing        Sloan Leiter, DO 07/15/23 1610

## 2023-07-15 LAB — CBC WITH DIFFERENTIAL/PLATELET
Abs Immature Granulocytes: 0.02 10*3/uL (ref 0.00–0.07)
Basophils Absolute: 0 10*3/uL (ref 0.0–0.1)
Basophils Relative: 1 %
Eosinophils Absolute: 0.1 10*3/uL (ref 0.0–0.5)
Eosinophils Relative: 1 %
HCT: 47 % (ref 39.0–52.0)
Hemoglobin: 16.2 g/dL (ref 13.0–17.0)
Immature Granulocytes: 0 %
Lymphocytes Relative: 15 %
Lymphs Abs: 1 10*3/uL (ref 0.7–4.0)
MCH: 32.1 pg (ref 26.0–34.0)
MCHC: 34.5 g/dL (ref 30.0–36.0)
MCV: 93.1 fL (ref 80.0–100.0)
Monocytes Absolute: 0.5 10*3/uL (ref 0.1–1.0)
Monocytes Relative: 8 %
Neutro Abs: 5.2 10*3/uL (ref 1.7–7.7)
Neutrophils Relative %: 75 %
Platelets: 236 10*3/uL (ref 150–400)
RBC: 5.05 MIL/uL (ref 4.22–5.81)
RDW: 13 % (ref 11.5–15.5)
WBC: 7 10*3/uL (ref 4.0–10.5)
nRBC: 0 % (ref 0.0–0.2)

## 2023-07-15 LAB — TROPONIN I (HIGH SENSITIVITY): Troponin I (High Sensitivity): 3 ng/L (ref <18)

## 2023-07-15 LAB — BASIC METABOLIC PANEL
Anion gap: 10 (ref 5–15)
BUN: 8 mg/dL (ref 6–20)
CO2: 24 mmol/L (ref 22–32)
Calcium: 9.8 mg/dL (ref 8.9–10.3)
Chloride: 102 mmol/L (ref 98–111)
Creatinine, Ser: 0.9 mg/dL (ref 0.61–1.24)
GFR, Estimated: >60 mL/min (ref >60)
Glucose, Bld: 119 mg/dL — ABNORMAL HIGH (ref 70–99)
Potassium: 3.5 mmol/L (ref 3.5–5.1)
Sodium: 136 mmol/L (ref 135–145)

## 2023-07-15 LAB — RESP PANEL BY RT-PCR (RSV, FLU A&B, COVID)  RVPGX2
Influenza A by PCR: NEGATIVE
Influenza B by PCR: NEGATIVE
Resp Syncytial Virus by PCR: NEGATIVE
SARS Coronavirus 2 by RT PCR: NEGATIVE

## 2023-07-15 LAB — BRAIN NATRIURETIC PEPTIDE: B Natriuretic Peptide: 20.3 pg/mL (ref 0.0–100.0)

## 2023-07-15 MED ORDER — AEROCHAMBER PLUS FLO-VU MEDIUM MISC
1.0000 | Freq: Once | Status: AC
  Administered 2023-07-15: 1
  Filled 2023-07-15: qty 1

## 2023-07-15 MED ORDER — PREDNISONE 20 MG PO TABS: 40.0000 mg | ORAL_TABLET | Freq: Every day | ORAL | 0 refills | Status: AC

## 2023-07-15 MED ORDER — ALBUTEROL SULFATE HFA 108 (90 BASE) MCG/ACT IN AERS: 1.0000 | INHALATION_SPRAY | Freq: Four times a day (QID) | RESPIRATORY_TRACT | 1 refills | Status: AC | PRN

## 2023-07-15 MED ORDER — OXYMETAZOLINE HCL 0.05 % NA SOLN: 1.0000 | Freq: Two times a day (BID) | NASAL | 0 refills | Status: AC

## 2023-07-15 MED ORDER — GUAIFENESIN-DM 100-10 MG/5ML PO SYRP: 5.0000 mL | ORAL_SOLUTION | ORAL | 0 refills | Status: AC | PRN

## 2023-07-15 MED ORDER — AEROCHAMBER PLUS FLO-VU MISC
1.0000 | Freq: Once | Status: DC
  Filled 2023-07-15: qty 1

## 2023-07-15 MED ORDER — CETIRIZINE HCL 10 MG PO TABS: 10.0000 mg | ORAL_TABLET | Freq: Every day | ORAL | 0 refills | Status: AC

## 2023-07-15 MED ORDER — AZITHROMYCIN 250 MG PO TABS: 250.0000 mg | ORAL_TABLET | Freq: Every day | ORAL | 0 refills | Status: AC

## 2023-07-15 NOTE — ED Notes (Signed)
RT educated pt on proper use of MDI w/spacer. Pt able to perform w/out difficulty. Pt verbalizes understanding of teaching.    07/15/23 0311  Therapy Vitals  Pulse Rate 85  Resp 16  Patient Position (if appropriate) Sitting  MEWS Score/Color  MEWS Score 0  MEWS Score Color Green  Respiratory Assessment  Assessment Type Pre-treatment  Respiratory Pattern Regular;Dyspnea at rest;Labored;Symmetrical  Chest Assessment Chest expansion symmetrical  Cough Strong;Congested;Productive  Bilateral Breath Sounds Clear;Diminished  R Upper  Breath Sounds Clear  L Upper Breath Sounds Clear  R Lower Breath Sounds Diminished  L Lower Breath Sounds Diminished  Oxygen Therapy/Pulse Ox  O2 Device Room Air  O2 Therapy Room air  FiO2 (%) 21 %  SpO2 94 %

## 2023-07-15 NOTE — Discharge Instructions (Addendum)
It was a pleasure caring for you today in the emergency department.  Consider using a humidifier in your bedroom.  Drink plenty of liquids over the next few days.  Get plenty of rest.  Please follow-up with PCP  Please return to the emergency department for any worsening or worrisome symptoms.

## 2023-07-31 ENCOUNTER — Emergency Department (HOSPITAL_BASED_OUTPATIENT_CLINIC_OR_DEPARTMENT_OTHER): Payer: 59 | Admitting: Radiology

## 2023-07-31 ENCOUNTER — Other Ambulatory Visit: Payer: Self-pay

## 2023-07-31 ENCOUNTER — Encounter (HOSPITAL_BASED_OUTPATIENT_CLINIC_OR_DEPARTMENT_OTHER): Payer: Self-pay | Admitting: Emergency Medicine

## 2023-07-31 DIAGNOSIS — Z1152 Encounter for screening for COVID-19: Secondary | ICD-10-CM | POA: Insufficient documentation

## 2023-07-31 DIAGNOSIS — R059 Cough, unspecified: Secondary | ICD-10-CM | POA: Diagnosis present

## 2023-07-31 DIAGNOSIS — J40 Bronchitis, not specified as acute or chronic: Secondary | ICD-10-CM | POA: Insufficient documentation

## 2023-07-31 NOTE — ED Triage Notes (Signed)
Patient presents with cough and post tussive vomiting that progressed since last night. Reports sx started last month and ongoing since then. Patient has appt with Pulmonologist Dec 30th to r/o asthma. States "I get better with medicine and once I complete them, I'm back to square one".

## 2023-08-01 ENCOUNTER — Emergency Department (HOSPITAL_BASED_OUTPATIENT_CLINIC_OR_DEPARTMENT_OTHER): Payer: 59

## 2023-08-01 ENCOUNTER — Emergency Department (HOSPITAL_BASED_OUTPATIENT_CLINIC_OR_DEPARTMENT_OTHER)
Admission: EM | Admit: 2023-08-01 | Discharge: 2023-08-01 | Disposition: A | Discharge status: Home / Self Care | Payer: 59 | Attending: Emergency Medicine | Admitting: Emergency Medicine

## 2023-08-01 DIAGNOSIS — J40 Bronchitis, not specified as acute or chronic: Secondary | ICD-10-CM

## 2023-08-01 LAB — GROUP A STREP BY PCR: Group A Strep by PCR: NOT DETECTED

## 2023-08-01 LAB — RESP PANEL BY RT-PCR (RSV, FLU A&B, COVID)  RVPGX2
Influenza A by PCR: NEGATIVE
Influenza B by PCR: NEGATIVE
Resp Syncytial Virus by PCR: NEGATIVE
SARS Coronavirus 2 by RT PCR: NEGATIVE

## 2023-08-01 MED ORDER — ALUM & MAG HYDROXIDE-SIMETH 200-200-20 MG/5ML PO SUSP
30.0000 mL | Freq: Once | ORAL | Status: AC
  Administered 2023-08-01: 30 mL via ORAL
  Filled 2023-08-01: qty 30

## 2023-08-01 MED ORDER — OXYCODONE HCL 5 MG PO TABS
5.0000 mg | ORAL_TABLET | Freq: Once | ORAL | Status: AC
  Administered 2023-08-01: 5 mg via ORAL
  Filled 2023-08-01: qty 1

## 2023-08-01 MED ORDER — PREDNISONE 10 MG PO TABS: ORAL_TABLET | ORAL | 0 refills | Status: AC

## 2023-08-01 MED ORDER — ALBUTEROL SULFATE HFA 108 (90 BASE) MCG/ACT IN AERS
2.0000 | INHALATION_SPRAY | Freq: Once | RESPIRATORY_TRACT | Status: AC
  Administered 2023-08-01: 2 via RESPIRATORY_TRACT
  Filled 2023-08-01: qty 6.7

## 2023-08-01 MED ORDER — LIDOCAINE VISCOUS HCL 2 % MT SOLN
15.0000 mL | Freq: Once | OROMUCOSAL | Status: AC
  Administered 2023-08-01: 15 mL via ORAL
  Filled 2023-08-01: qty 15

## 2023-08-01 NOTE — ED Notes (Signed)
Patient transported to CT 

## 2023-08-01 NOTE — ED Provider Notes (Signed)
[facility] Emergency Department Provider Note MRN:  [id number]  Arrival date & time: 08/01/23     Chief Complaint   Cough History of Present Illness   John Moses is a 57 y.o. year-old male with no pertinent past medical history presenting to the ED with chief complaint of cough.  Persistent cough and shortness of breath for nearly a month, getting worse, sore throat, posttussive emesis, cannot stop coughing.  Denies fever.  Review of Systems  A thorough review of systems was obtained and all systems are negative except as noted in the HPI and PMH.   Patient's Health History    Past Medical History:  Diagnosis Date   Left leg pain     Past Surgical History:  Procedure Laterality Date   left hand      Family History  Problem Relation Age of Onset   High blood pressure Mother    Diabetes Mother         Physical Exam   Vitals:   08/01/23 0253 08/01/23 0330  BP:  118/79  Pulse:  60  Resp:  18  Temp:    SpO2: 98% 93%    CONSTITUTIONAL: Well-appearing, NAD NEURO/PSYCH:  Alert and oriented x 3, no focal deficits EYES:  eyes equal and reactive ENT/NECK:  no LAD, no JVD CARDIO: Regular rate, well-perfused, normal S1 and S2 PULM: Rhonchi greater on the right GI/GU:  non-distended, non-tender MSK/SPINE:  No gross deformities, no edema SKIN:  no rash, atraumatic   *Additional and/or pertinent findings included in MDM below  Diagnostic and Interventional Summary    EKG Interpretation Date/Time:    Ventricular Rate:    PR Interval:    QRS Duration:    QT Interval:    QTC Calculation:   R Axis:      Text Interpretation:         Labs Reviewed  RESP PANEL BY RT-PCR (RSV, FLU A&B, COVID)  RVPGX2  GROUP A STREP BY PCR    CT Chest Wo Contrast  Final Result    DG Chest 2 View  Final Result      Medications  alum & mag hydroxide-simeth (MAALOX/MYLANTA) 200-200-20 MG/5ML suspension 30 mL (30 mLs Oral Given 08/01/23 0232)     And  lidocaine (XYLOCAINE) 2 % viscous mouth solution 15 mL (15 mLs Oral Given 08/01/23 0233)  oxyCODONE (Oxy IR/ROXICODONE) immediate release tablet 5 mg (5 mg Oral Given 08/01/23 0230)  albuterol (VENTOLIN HFA) 108 (90 Base) MCG/ACT inhaler 2 puff (2 puffs Inhalation Given 08/01/23 0252)     Procedures  /  Critical Care Procedures  ED Course and Medical Decision Making  Initial Impression and Ddx Question pneumonia not cleared by recent antibiotics versus lung mass.  Strep throat also considered given the erythema to the posterior oropharynx  Past medical/surgical history that increases complexity of ED encounter: None  Interpretation of Diagnostics I personally reviewed the Chest Xray and my interpretation is as follows: No lobar opacity or pneumothorax  CT revealing diffuse bronchial thickening, question bronchitis of unclear cause.  Patient Reassessment and Ultimate Disposition/Management     Patient feeling better on reassessment, did feel better for a while after first round of steroids, will provide a taper which should provide some relief until he can see pulmonology to better explain this diffuse bronchial thickening.  Patient management required discussion with the following services or consulting groups:  None  Complexity of Problems Addressed Acute illness or injury that poses threat  of life of bodily function  Additional Data Reviewed and Analyzed Further history obtained from: Further history from spouse/family member  Additional Factors Impacting ED Encounter Risk Prescriptions and Consideration of hospitalization  Elmer Sow. Pilar Plate, MD [facility] Emergency Medicine [facility] [email or web address]  Final Clinical Impressions(s) / ED Diagnoses     ICD-10-CM   1. Bronchitis  J40       ED Discharge Orders          Ordered    predniSONE (DELTASONE) 10 MG tablet        08/01/23 0357             Discharge Instructions Discussed  with and Provided to Patient:     Discharge Instructions      You were evaluated in the Emergency Department and after careful evaluation, we did not find any emergent condition requiring admission or further testing in the hospital.  Your exam/testing today is overall reassuring.  We discussed the lung inflammation found on CT.  Keep your pulmonology appointment.  Take the prednisone taper as prescribed.  Please return to the Emergency Department if you experience any worsening of your condition.   Thank you for allowing [place] to be a part of your care.       Sabas Sous, MD 08/01/23 [phone]

## 2023-08-01 NOTE — ED Notes (Addendum)
Pain has improved.  

## 2023-08-01 NOTE — Discharge Instructions (Signed)
You were evaluated in the Emergency Department and after careful evaluation, we did not find any emergent condition requiring admission or further testing in the hospital.  Your exam/testing today is overall reassuring.  We discussed the lung inflammation found on CT.  Keep your pulmonology appointment.  Take the prednisone taper as prescribed.  Please return to the Emergency Department if you experience any worsening of your condition.   Thank you for allowing [place] to be a part of your care.

## 2023-08-01 NOTE — ED Notes (Signed)
Pt c/o SHOB and cough for several weeks.  Has appt with pulmonologist 12/30 Lungs sounds are diminshed,  NAD

## 2024-02-21 ENCOUNTER — Other Ambulatory Visit: Payer: Self-pay | Admitting: Physician Assistant

## 2024-02-21 DIAGNOSIS — H9311 Tinnitus, right ear: Secondary | ICD-10-CM

## 2024-02-21 DIAGNOSIS — H903 Sensorineural hearing loss, bilateral: Secondary | ICD-10-CM

## 2024-02-25 ENCOUNTER — Encounter: Payer: Self-pay | Admitting: Physician Assistant

## 2024-03-24 ENCOUNTER — Ambulatory Visit
Admission: RE | Admit: 2024-03-24 | Discharge: 2024-03-24 | Disposition: A | Discharge status: Home / Self Care | Source: Ambulatory Visit | Attending: Physician Assistant | Admitting: Physician Assistant

## 2024-03-24 DIAGNOSIS — H903 Sensorineural hearing loss, bilateral: Secondary | ICD-10-CM

## 2024-03-24 DIAGNOSIS — H9311 Tinnitus, right ear: Secondary | ICD-10-CM

## 2024-03-24 MED ORDER — GADOPICLENOL 0.5 MMOL/ML IV SOLN
10.0000 mL | Freq: Once | INTRAVENOUS | Status: AC | PRN
  Administered 2024-03-24: 10 mL via INTRAVENOUS
